# Patient Record
Sex: Female | Born: 1970 | Race: Black or African American | Hispanic: No | Marital: Single | Smoking: Never smoker
Health system: Southern US, Community
[De-identification: ages and names within clinical notes are randomized; demographics above are authoritative.]

## PROBLEM LIST (undated history)

## (undated) DIAGNOSIS — E079 Disorder of thyroid, unspecified: Secondary | ICD-10-CM

## (undated) HISTORY — PX: ABDOMINAL SURGERY: SHX537

---

## 2016-05-11 ENCOUNTER — Other Ambulatory Visit: Payer: Self-pay | Admitting: Internal Medicine

## 2016-05-11 DIAGNOSIS — Z1231 Encounter for screening mammogram for malignant neoplasm of breast: Secondary | ICD-10-CM

## 2016-06-01 ENCOUNTER — Ambulatory Visit
Admission: RE | Admit: 2016-06-01 | Discharge: 2016-06-01 | Disposition: A | Discharge status: Home / Self Care | Payer: PRIVATE HEALTH INSURANCE | Source: Ambulatory Visit | Attending: Internal Medicine | Admitting: Internal Medicine

## 2016-06-01 DIAGNOSIS — Z1231 Encounter for screening mammogram for malignant neoplasm of breast: Secondary | ICD-10-CM

## 2016-06-17 ENCOUNTER — Other Ambulatory Visit (HOSPITAL_COMMUNITY)
Admission: RE | Admit: 2016-06-17 | Discharge: 2016-06-17 | Disposition: A | Discharge status: Home / Self Care | Payer: No Typology Code available for payment source | Source: Ambulatory Visit | Attending: Obstetrics and Gynecology | Admitting: Obstetrics and Gynecology

## 2016-06-17 ENCOUNTER — Other Ambulatory Visit: Payer: Self-pay | Admitting: Obstetrics and Gynecology

## 2016-06-17 DIAGNOSIS — Z1151 Encounter for screening for human papillomavirus (HPV): Secondary | ICD-10-CM | POA: Insufficient documentation

## 2016-06-17 DIAGNOSIS — Z01419 Encounter for gynecological examination (general) (routine) without abnormal findings: Secondary | ICD-10-CM | POA: Insufficient documentation

## 2016-06-21 LAB — CYTOLOGY - PAP
Diagnosis: NEGATIVE
HPV: NOT DETECTED

## 2017-11-29 ENCOUNTER — Emergency Department (HOSPITAL_COMMUNITY)
Admission: EM | Admit: 2017-11-29 | Discharge: 2017-11-29 | Disposition: A | Discharge status: Home / Self Care | Payer: No Typology Code available for payment source | Attending: Emergency Medicine | Admitting: Emergency Medicine

## 2017-11-29 ENCOUNTER — Other Ambulatory Visit: Payer: Self-pay

## 2017-11-29 ENCOUNTER — Encounter (HOSPITAL_COMMUNITY): Payer: Self-pay

## 2017-11-29 DIAGNOSIS — Y92410 Unspecified street and highway as the place of occurrence of the external cause: Secondary | ICD-10-CM | POA: Diagnosis not present

## 2017-11-29 DIAGNOSIS — M549 Dorsalgia, unspecified: Secondary | ICD-10-CM | POA: Diagnosis present

## 2017-11-29 DIAGNOSIS — M7918 Myalgia, other site: Secondary | ICD-10-CM | POA: Diagnosis not present

## 2017-11-29 HISTORY — DX: Disorder of thyroid, unspecified: E07.9

## 2017-11-29 MED ORDER — METHOCARBAMOL 500 MG PO TABS: 500.0000 mg | ORAL_TABLET | Freq: Two times a day (BID) | ORAL | 0 refills | Status: AC

## 2017-11-29 MED ORDER — LIDOCAINE 5 % EX PTCH: 1.0000 | MEDICATED_PATCH | CUTANEOUS | 0 refills | Status: AC

## 2017-11-29 MED ORDER — ACETAMINOPHEN 325 MG PO TABS
650.0000 mg | ORAL_TABLET | Freq: Once | ORAL | Status: AC
  Administered 2017-11-29: 650 mg via ORAL
  Filled 2017-11-29: qty 2
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
## 2017-11-29 NOTE — ED Notes (Signed)
ED Provider at bedside. 
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
## 2017-11-29 NOTE — ED Notes (Signed)
Patient verbalizes understanding of discharge instructions. Opportunity for questioning and answers were provided. Armband removed by staff, pt discharged from ED ambulatory.   

## 2019-05-27 ENCOUNTER — Ambulatory Visit: Payer: PRIVATE HEALTH INSURANCE | Attending: Internal Medicine

## 2019-05-27 DIAGNOSIS — Z20822 Contact with and (suspected) exposure to covid-19: Secondary | ICD-10-CM

## 2019-05-28 LAB — SARS-COV-2, NAA 2 DAY TAT

## 2019-05-28 LAB — NOVEL CORONAVIRUS, NAA: SARS-CoV-2, NAA: NOT DETECTED

## 2019-12-30 ENCOUNTER — Ambulatory Visit (INDEPENDENT_AMBULATORY_CARE_PROVIDER_SITE_OTHER): Payer: 59 | Admitting: Orthopedic Surgery

## 2019-12-30 ENCOUNTER — Ambulatory Visit: Payer: Self-pay

## 2019-12-30 ENCOUNTER — Ambulatory Visit (INDEPENDENT_AMBULATORY_CARE_PROVIDER_SITE_OTHER): Payer: 59

## 2019-12-30 ENCOUNTER — Encounter: Payer: Self-pay | Admitting: Orthopedic Surgery

## 2019-12-30 DIAGNOSIS — M79671 Pain in right foot: Secondary | ICD-10-CM | POA: Diagnosis not present

## 2019-12-30 DIAGNOSIS — M79672 Pain in left foot: Secondary | ICD-10-CM | POA: Diagnosis not present

## 2019-12-30 NOTE — Progress Notes (Signed)
   Office Visit Note   Patient: Gabriella Sherman           Date of Birth: 07/05/1970           MRN: [id number] Visit Date: 12/30/2019              Requested by: Renford Dills, MD [address] [place],  [place] [address] PCP: Renford Dills, MD  Chief Complaint  Patient presents with  . Left Foot - Pain    Heel pain referral from Dr. Nehemiah Settle       HPI: Patient is a 49 year old woman who is seen for initial evaluation for bilateral heel pain.  She complains of pain with walking and driving in both heels pain posteriorly not over the Achilles or over the plantar fascia.  Assessment & Plan: Visit Diagnoses:  1. Pain of left heel   2. Pain of right heel     Plan: Patient has a large callus on both feet.  Recommended thin wool socks to absorb the moisture recommended a well cushioned sneaker like [place]oka.  Follow-up if this does not improve.  Follow-Up Instructions: Return if symptoms worsen or fail to improve.   Ortho Exam  Patient is alert, oriented, no adenopathy, well-dressed, normal affect, normal respiratory effort. Examination patient has good pulses bilaterally she has good ankle good subtalar motion there is no pain at rest.  She has no pain to palpation over the Achilles no pain to palpation over the plantar fascia she has good dorsiflexion of the ankle.  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  No nodules in the Achilles and no plantar fascial nodules.  Imaging: XR Os Calcis Right  Result Date: 12/30/2019 2 view radiographs of the right calcaneus shows no evidence of fracture no bony spurs.  No images are attached to the encounter.  Labs: No results found for: HGBA1C, ESRSEDRATE, CRP, LABURIC, REPTSTATUS, GRAMSTAIN, CULT, LABORGA   No results found for: ALBUMIN, PREALBUMIN, LABURIC  No results found for: MG No  results found for: VD25OH  No results found for: PREALBUMIN No flowsheet data found.   There is no height or weight on file to calculate BMI.  Orders:  Orders Placed This Encounter  Procedures  . XR Os Calcis Left  . XR Os Calcis Right   No orders of the defined types were placed in this encounter.    Procedures: No procedures performed  Clinical Data: No additional findings.  ROS:  All other systems negative, except as noted in the HPI. Review of Systems  Objective: Vital Signs: There were no vitals taken for this visit.  Specialty Comments:  No specialty comments available.  PMFS History: There are no problems to display for this patient.  Past Medical History:  Diagnosis Date  . Thyroid disease     History reviewed. No pertinent family history.  Past Surgical History:  Procedure Laterality Date  . ABDOMINAL SURGERY     Social History   Occupational History  . Not on file  Tobacco Use  . Smoking status: Never Smoker  . Smokeless tobacco: Never Used  Vaping Use  . Vaping Use: Never used  Substance and Sexual Activity  . Alcohol use: Never  . Drug use: Never  . Sexual activity: Not Currently    Birth control/protection: Pill

## 2020-08-10 ENCOUNTER — Other Ambulatory Visit: Payer: Self-pay

## 2020-08-10 ENCOUNTER — Other Ambulatory Visit: Payer: Self-pay | Admitting: Internal Medicine

## 2020-08-10 ENCOUNTER — Ambulatory Visit
Admission: RE | Admit: 2020-08-10 | Discharge: 2020-08-10 | Disposition: A | Discharge status: Home / Self Care | Payer: 59 | Source: Ambulatory Visit | Attending: Internal Medicine | Admitting: Internal Medicine

## 2020-08-10 DIAGNOSIS — R198 Other specified symptoms and signs involving the digestive system and abdomen: Secondary | ICD-10-CM

## 2020-12-05 ENCOUNTER — Encounter (HOSPITAL_COMMUNITY): Payer: Self-pay | Admitting: Emergency Medicine

## 2020-12-05 ENCOUNTER — Other Ambulatory Visit: Payer: Self-pay

## 2020-12-05 ENCOUNTER — Ambulatory Visit (HOSPITAL_COMMUNITY)
Admission: EM | Admit: 2020-12-05 | Discharge: 2020-12-05 | Disposition: A | Discharge status: Home / Self Care | Payer: 59 | Attending: Student | Admitting: Student

## 2020-12-05 DIAGNOSIS — I493 Ventricular premature depolarization: Secondary | ICD-10-CM

## 2020-12-05 DIAGNOSIS — R9431 Abnormal electrocardiogram [ECG] [EKG]: Secondary | ICD-10-CM

## 2020-12-05 LAB — POC INFLUENZA A AND B ANTIGEN (URGENT CARE ONLY)
INFLUENZA A ANTIGEN, POC: NEGATIVE
INFLUENZA B ANTIGEN, POC: NEGATIVE

## 2020-12-05 LAB — CBG MONITORING, ED: Glucose-Capillary: 95 mg/dL (ref 70–99)

## 2020-12-05 NOTE — ED Provider Notes (Signed)
[facility]    CSN: [id number] Arrival date & time: 12/05/20  1034      History   Chief Complaint Chief Complaint  Patient presents with   Dizziness    HPI Gabriella Sherman is a 50 y.o. female presenting with dizziness and nausea x1 day. Medical history hypothyroid, taking daily levothyroxine. States symptoms are overall improving since this morning. Describes the dizziness as sensation of room spinning, constant. No triggers or exacerbations, denies positional or exertional component. Initially with nausea and one episode of vomiting but no longer. Denies CP, SOB, weakness. Denies other symptoms like fevers/chills, cough, congestion. Denies history cardio ds. Followed closely by PCP.  HPI  Past Medical History:  Diagnosis Date   Thyroid disease     There are no problems to display for this patient.   Past Surgical History:  Procedure Laterality Date   ABDOMINAL SURGERY      OB History   No obstetric history on file.      Home Medications    Prior to Admission medications   Medication Sig Start Date End Date Taking? Authorizing Provider  levothyroxine (SYNTHROID) 100 MCG tablet Take 100 mcg by mouth daily. 11/23/20   [provider]  lidocaine (LIDODERM) 5 % Place 1 patch onto the skin daily. Remove & Discard patch within 12 hours or as directed by MD Patient not taking: Reported on 12/05/2020 11/29/17   Harlene Salts A, PA-C  methocarbamol (ROBAXIN) 500 MG tablet Take 1 tablet (500 mg total) by mouth 2 (two) times daily. Patient not taking: Reported on 12/05/2020 11/29/17   Bill Salinas PA-C    Family History Family History  Problem Relation Age of Onset   Hypertension Mother    Diabetes Father     Social History Social History   Tobacco Use   Smoking status: Never   Smokeless tobacco: Never  Vaping Use   Vaping Use: Never used  Substance Use Topics   Alcohol use: Never   Drug use: Never     Allergies   Percocet  [oxycodone-acetaminophen] and Vicodin [hydrocodone-acetaminophen]   Review of Systems Review of Systems  Respiratory:  Negative for shortness of breath.   Cardiovascular:  Negative for chest pain, palpitations and leg swelling.  Gastrointestinal:  Negative for nausea and vomiting.  Neurological:  Positive for dizziness.  All other systems reviewed and are negative.   Physical Exam Triage Vital Signs ED Triage Vitals  Enc Vitals Group     BP 12/05/20 1209 (!) 143/88     Pulse Rate 12/05/20 1209 73     Resp 12/05/20 1209 18     Temp 12/05/20 1209 98.3 F (36.8 C)     Temp Source 12/05/20 1209 Oral     SpO2 12/05/20 1209 96 %     Weight --      Height --      Head Circumference --      Peak Flow --      Pain Score 12/05/20 1206 0     Pain Loc --      Pain Edu? --      Excl. in GC? --    No data found.  Updated Vital Signs BP (!) 143/88 (BP Location: Left Arm)   Pulse 73   Temp 98.3 F (36.8 C) (Oral)   Resp 18   LMP 11/28/2017   SpO2 96%   Visual Acuity Right Eye Distance:   Left Eye Distance:   Bilateral Distance:  Right Eye Near:   Left Eye Near:    Bilateral Near:     Physical Exam Vitals reviewed.  Constitutional:      Appearance: Normal appearance. She is not diaphoretic.  HENT:     Head: Normocephalic and atraumatic.     Mouth/Throat:     Mouth: Mucous membranes are moist.  Eyes:     Extraocular Movements: Extraocular movements intact.     Pupils: Pupils are equal, round, and reactive to light.  Cardiovascular:     Rate and Rhythm: Normal rate and regular rhythm.     Pulses:          Radial pulses are 2+ on the right side and 2+ on the left side.     Heart sounds: Normal heart sounds.  Pulmonary:     Effort: Pulmonary effort is normal.     Breath sounds: Normal breath sounds.  Abdominal:     Palpations: Abdomen is soft.     Tenderness: There is no abdominal tenderness. There is no guarding or rebound.  Musculoskeletal:     Right lower  leg: No edema.     Left lower leg: No edema.  Skin:    General: Skin is warm.     Capillary Refill: Capillary refill takes less than 2 seconds.  Neurological:     General: No focal deficit present.     Mental Status: She is alert and oriented to person, place, and time.     Comments: CN 2-12 grossly intact, PERRLA, EOMI. Strength and sensation grossly intact. Negative rhomberg.  Psychiatric:        Mood and Affect: Mood normal.        Behavior: Behavior normal.        Thought Content: Thought content normal.        Judgment: Judgment normal.     UC Treatments / Results  Labs (all labs ordered are listed, but only abnormal results are displayed) Labs Reviewed  POC INFLUENZA A AND B ANTIGEN (URGENT CARE ONLY)  CBG MONITORING, ED    EKG   Radiology No results found.  Procedures Procedures (including critical care time)  Medications Ordered in UC Medications - No data to display  Initial Impression / Assessment and Plan / UC Course  I have reviewed the triage vital signs and the nursing notes.  Pertinent labs & imaging results that were available during my care of the patient were reviewed by me and considered in my medical decision making (see chart for details).     This patient is a very pleasant 50 y.o. year old female presenting with PVCs.   EKG with PVCs. No prior EKG for comparison  Symptoms are resolving on their own and patient adamantly declines ED referral. She will closely monitor symptoms at home and head to ED if they worsen. Call PCP next business day for follow-up and cardio referral.  Recommended ED evaluation, patient prefers to f/u with cardio outpatient. Discharged to home with strict precautions.  Final Clinical Impressions(s) / UC Diagnoses   Final diagnoses:  Premature ventricular contraction  Nonspecific abnormal electrocardiogram (ECG) (EKG)     Discharge Instructions      Premature ventricular contractions (PVCs) are extra  heartbeats that begin in one of the heart's two lower pumping chambers (ventricles). These extra beats disrupt the regular heart rhythm, sometimes causing a sensation of a fluttering or a skipped beat in the chest.  Occasional premature ventricular contractions in people without heart disease usually aren't a  concern and likely don't need treatment. You might need treatment if the premature ventricular contractions are very frequent or bothersome, or if you have an underlying heart condition.  Please call your PCP in 2 days (Monday) to schedule a follow-up. You will need to see a cardiologist.  If symptoms get worse in the next 2 days- shortness of breath, weakness, chest pain, dizziness, etc - head straight to the ED or call 911.     ED Prescriptions   None    PDMP not reviewed this encounter.   Rhys Martini, PA-C 12/05/20 1301

## 2020-12-05 NOTE — Discharge Instructions (Addendum)
Premature ventricular contractions (PVCs) are extra heartbeats that begin in one of the heart's two lower pumping chambers (ventricles). These extra beats disrupt the regular heart rhythm, sometimes causing a sensation of a fluttering or a skipped beat in the chest.  Occasional premature ventricular contractions in people without heart disease usually aren't a concern and likely don't need treatment. You might need treatment if the premature ventricular contractions are very frequent or bothersome, or if you have an underlying heart condition.  Please call your PCP in 2 days (Monday) to schedule a follow-up. You will need to see a cardiologist.  If symptoms get worse in the next 2 days- shortness of breath, weakness, chest pain, dizziness, etc - head straight to the ED or call 911.

## 2020-12-05 NOTE — ED Triage Notes (Signed)
Dizziness and nausea that started today.  Denies chest pain.  Reports dizziness has improved.  In the morning, reports room was spinning

## 2020-12-21 NOTE — Progress Notes (Addendum)
Cardiology Office Note:    Date:  12/21/2020   ID:  Trinh Sanjose, DOB November 12, 1970, MRN [id number]  PCP:  Renford Dills, MD   [facility] Providers Cardiologist:  None     Referring MD: Emilio Aspen, *   No chief complaint on file. PVCs   History of Present Illness:    Gabriella Sherman is a 50 y.o. female with a hx of thyroid dx, migraines and obesity, seen in the ED 12/05/20 with dizziness and nause, EKG shows bigeminy with RVOT PVCs  She has no heart disease history.  No testing. She had sever dizziness, nausea and weakness prompting her to go to the ED in November. It lasted for 4-5 hours then it improved. She had COVID in July. She denies chest pain. She denies short of breath. She feels fast heart beats. She gets lightheadedness for a few minutes. No syncope.She has a ziopatch on. She had an echocardiogram, not in the system. She was told that the echo showed frequent PVCs. No family hx of arrhythmia. She has family hx of hypertension. No family hx of SCD. No smoking history.   She has hypothyroidism. She had TSH 6.87 and synthroid dose was increased.   Past Medical History:  Diagnosis Date   Thyroid disease     Past Surgical History:  Procedure Laterality Date   ABDOMINAL SURGERY      Current Medications: No outpatient medications have been marked as taking for the 12/22/20 encounter (Appointment) with Maisie Fus, MD.     Allergies:   Percocet [oxycodone-acetaminophen] and Vicodin [hydrocodone-acetaminophen]   Social History   Socioeconomic History   Marital status: Single    Spouse name: Not on file   Number of children: Not on file   Years of education: Not on file   Highest education level: Not on file  Occupational History   Not on file  Tobacco Use   Smoking status: Never   Smokeless tobacco: Never  Vaping Use   Vaping Use: Never used  Substance and Sexual Activity   Alcohol use: Never   Drug use: Never   Sexual activity: Not Currently     Birth control/protection: None  Other Topics Concern   Not on file  Social History Narrative   Not on file   Social Determinants of Health   Financial Resource Strain: Not on file  Food Insecurity: Not on file  Transportation Needs: Not on file  Physical Activity: Not on file  Stress: Not on file  Social Connections: Not on file     Family History: The patient's family history includes Diabetes in her father; Hypertension in her mother.  ROS:   Please see the history of present illness.     All other systems reviewed and are negative.  EKGs/Labs/Other Studies Reviewed:    The following studies were reviewed today:   EKG:  EKG is  ordered today.  The ekg ordered today demonstrates  NSR , minimal ventricular ectopy, RVOT PVCs  Recent Labs: No results found for requested labs within last 8760 hours.  Recent Lipid Panel No results found for: CHOL, TRIG, HDL, CHOLHDL, VLDL, LDLCALC, LDLDIRECT   Risk Assessment/Calculations:     The 10-year ASCVD risk score (Arnett DK, et al., 2019) is: 1.8%   Values used to calculate the score:     Age: 34 years     Sex: Female     Is Non-Hispanic African American: Yes     Diabetic: No  Tobacco smoker: No     Systolic Blood Pressure: 130 mmHg     Is BP treated: No     HDL Cholesterol: 66 MG/DL     Total Cholesterol: 195 MG/DL       Physical Exam:    VS:  LMP 11/28/2017     Wt Readings from Last 3 Encounters:  11/29/17 170 lb (77.1 kg)     GEN:  Well nourished, well developed in no acute distress HEENT: Normal NECK: No JVD; No carotid bruits LYMPHATICS: No lymphadenopathy CARDIAC: RRR, no murmurs, rubs, gallops RESPIRATORY:  Clear to auscultation without rales, wheezing or rhonchi  ABDOMEN: Soft, non-tender, non-distended MUSCULOSKELETAL:  No edema; No deformity  SKIN: Warm and dry NEUROLOGIC:  Alert and oriented x 3 PSYCHIATRIC:  Normal affect   ASSESSMENT:    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 Ddx includes benign ; however can r/o ARVC. She has no prior cardiac imaging in our system. ? Epsilon wave of the PVC beat in V1 in November, not  there today. Will plan for cardiac MRI. She has a ziopatch on and I can follow up results as long as her PCP sends them over. Will add low dose beta blocker. Will FU in 3 months; if burden is high/ more symptoms will start BB and EP referral at that time.  PLAN:    In order of problems listed above:  Cardiac MRI Metoprolol 12.5 mg XL Follow up 3 months      Medication Adjustments/Labs and Tests Ordered: Current medicines are reviewed at length with the patient today.  Concerns regarding medicines are outlined above.   Signed, Maisie Fus, MD  12/21/2020 1:58 PM    [facility]

## 2020-12-22 ENCOUNTER — Other Ambulatory Visit: Payer: Self-pay

## 2020-12-22 ENCOUNTER — Encounter: Payer: Self-pay | Admitting: Internal Medicine

## 2020-12-22 ENCOUNTER — Ambulatory Visit (INDEPENDENT_AMBULATORY_CARE_PROVIDER_SITE_OTHER): Payer: 59 | Admitting: Internal Medicine

## 2020-12-22 VITALS — BP 130/73 | HR 66 | Ht 63.0 in | Wt 169.4 lb

## 2020-12-22 DIAGNOSIS — R9431 Abnormal electrocardiogram [ECG] [EKG]: Secondary | ICD-10-CM

## 2020-12-22 DIAGNOSIS — I493 Ventricular premature depolarization: Secondary | ICD-10-CM

## 2020-12-22 LAB — BASIC METABOLIC PANEL
BUN/Creatinine Ratio: 15 (ref 9–23)
BUN: 13 mg/dL (ref 6–24)
CO2: 24 mmol/L (ref 20–29)
Calcium: 10 mg/dL (ref 8.7–10.2)
Chloride: 104 mmol/L (ref 96–106)
Creatinine, Ser: 0.85 mg/dL (ref 0.57–1.00)
Glucose: 96 mg/dL (ref 70–99)
Potassium: 4.2 mmol/L (ref 3.5–5.2)
Sodium: 140 mmol/L (ref 134–144)
eGFR: 83 mL/min/{1.73_m2} (ref >59)

## 2020-12-22 LAB — CBC
Hematocrit: 36.3 % (ref 34.0–46.6)
Hemoglobin: 12.4 g/dL (ref 11.1–15.9)
MCH: 28.5 pg (ref 26.6–33.0)
MCHC: 34.2 g/dL (ref 31.5–35.7)
MCV: 83 fL (ref 79–97)
Platelets: 163 10*3/uL (ref 150–450)
RBC: 4.35 x10E6/uL (ref 3.77–5.28)
RDW: 14.2 % (ref 11.7–15.4)
WBC: 4.8 10*3/uL (ref 3.4–10.8)

## 2020-12-22 MED ORDER — METOPROLOL SUCCINATE ER 25 MG PO TB24: 12.5000 mg | ORAL_TABLET | Freq: Every day | ORAL | 3 refills | Status: DC

## 2020-12-22 NOTE — Addendum Note (Signed)
Addended byCarolan Clines on: 12/22/2020 08:40 AM   Modules accepted: Orders

## 2020-12-22 NOTE — Patient Instructions (Addendum)
Medication Instructions:  START: METOPROLOL SUCCINATE 12.5mg  (1/2) TABLET ONCE DAILY  *If you need a refill on your cardiac medications before your next appointment, please call your pharmacy*  Lab Work: BMET AND CBC TODAY  If you have labs (blood work) drawn today and your tests are completely normal, you will receive your results only by: MyChart Message (if you have MyChart) OR A paper copy in the mail If you have any lab test that is abnormal or we need to change your treatment, we will call you to review the results.  Testing/Procedures: Your physician has requested that you have a cardiac MRI. Cardiac MRI uses a computer to create images of your heart as its beating, producing both still and moving pictures of your heart and major blood vessels. For further information please visit [email or web address]. Please follow the instruction sheet given to you today for more information.     You are scheduled for Cardiac MRI on ______________. Please arrive at the [facility] main entrance of [facility] at ________________ (30-45 minutes prior to test start time). ?  [facility] [address] [place], [place] [address] [phone]  Please take advantage of the free valet parking available at the MAIN entrance (A entrance). Proceed to the [facility] Radiology Department (First Floor). ? Magnetic resonance imaging (MRI) is a painless test that produces images of the inside of the body without using Xrays.  During an MRI, strong magnets and radio waves work together in a Data processing manager to form detailed images.   MRI images may provide more details about a medical condition than X-rays, CT scans, and ultrasounds can provide.  You may be given earphones to listen for instructions.  You may eat a light breakfast and take medications as ordered with the exception of HCTZ (fluid pill, other). Please avoid stimulants for 12 hr prior to test. (Ie. Caffeine, nicotine,  chocolate, or antihistamine medications)  If a contrast material will be used, an IV will be inserted into one of your veins. Contrast material will be injected into your IV. It will leave your body through your urine within a day. You may be told to drink plenty of fluids to help flush the contrast material out of your system.  You will be asked to remove all metal, including: Watch, jewelry, and other metal objects including hearing aids, hair pieces and dentures. Also wearable glucose monitoring systems (ie. Freestyle Libre and Omnipods) (Braces and fillings normally are not a problem.)   TEST WILL TAKE APPROXIMATELY 1 HOUR  PLEASE NOTIFY SCHEDULING AT LEAST 24 HOURS IN ADVANCE IF YOU ARE UNABLE TO KEEP YOUR APPOINTMENT. [phone]  Please call Rockwell Alexandria, cardiac imaging nurse navigator with any questions/concerns. Rockwell Alexandria RN Navigator Cardiac Imaging Larey Brick RN Navigator Cardiac Imaging [facility] [phone] Office   Follow-Up: At [facility], you and your health needs are our priority.  As part of our continuing mission to provide you with exceptional heart care, we have created designated Provider Care Teams.  These Care Teams include your primary Cardiologist (physician) and Advanced Practice Providers (APPs -  Physician Assistants and Nurse Practitioners) who all work together to provide you with the care you need, when you need it.  Your next appointment:   3 month(s)  The format for your next appointment:   In Person  Provider:   Maisie Fus, MD

## 2020-12-30 ENCOUNTER — Other Ambulatory Visit: Payer: Self-pay | Admitting: Internal Medicine

## 2020-12-30 DIAGNOSIS — Z1231 Encounter for screening mammogram for malignant neoplasm of breast: Secondary | ICD-10-CM

## 2021-01-04 ENCOUNTER — Ambulatory Visit
Admission: RE | Admit: 2021-01-04 | Discharge: 2021-01-04 | Disposition: A | Discharge status: Home / Self Care | Payer: 59 | Source: Ambulatory Visit | Attending: Internal Medicine | Admitting: Internal Medicine

## 2021-01-04 DIAGNOSIS — Z1231 Encounter for screening mammogram for malignant neoplasm of breast: Secondary | ICD-10-CM

## 2021-01-14 ENCOUNTER — Telehealth (HOSPITAL_COMMUNITY): Payer: Self-pay | Admitting: Emergency Medicine

## 2021-01-14 NOTE — Telephone Encounter (Signed)
Reaching out to patient to offer assistance regarding upcoming cardiac imaging study; pt verbalizes understanding of appt date/time, parking situation and where to check in, and verified current allergies; name and call back number provided for further questions should they arise Rockwell Alexandria RN Navigator Cardiac Imaging [facility] [phone] office [phone] cell   Pt requesting one time dose antianxiety medication for claustro (message sent to Dr. Wyline Mood) Some IV difficulty in the past Denies metal implants Arrival time [phone]

## 2021-01-15 ENCOUNTER — Other Ambulatory Visit: Payer: Self-pay

## 2021-01-15 ENCOUNTER — Ambulatory Visit (HOSPITAL_COMMUNITY)
Admission: RE | Admit: 2021-01-15 | Discharge: 2021-01-15 | Disposition: A | Discharge status: Home / Self Care | Payer: 59 | Source: Ambulatory Visit | Attending: Internal Medicine | Admitting: Internal Medicine

## 2021-01-15 DIAGNOSIS — R9431 Abnormal electrocardiogram [ECG] [EKG]: Secondary | ICD-10-CM | POA: Diagnosis present

## 2021-01-15 DIAGNOSIS — I493 Ventricular premature depolarization: Secondary | ICD-10-CM | POA: Insufficient documentation

## 2021-01-15 MED ORDER — LORAZEPAM 2 MG/ML IJ SOLN: 0.5000 mg | Freq: Once | INTRAMUSCULAR | Status: DC

## 2021-01-15 MED ORDER — GADOBUTROL 1 MMOL/ML IV SOLN
10.0000 mL | Freq: Once | INTRAVENOUS | Status: AC | PRN
  Administered 2021-01-15: 11:00:00 10 mL via INTRAVENOUS

## 2021-01-15 MED ORDER — LORAZEPAM 2 MG/ML IJ SOLN
INTRAMUSCULAR | Status: AC
  Filled 2021-01-15: qty 1

## 2021-01-21 ENCOUNTER — Telehealth: Payer: Self-pay | Admitting: Internal Medicine

## 2021-01-21 NOTE — Telephone Encounter (Signed)
Spoke with Gabriella Sherman . She had zio patch ordered by her PCP showing < 1 % burden of atrial flutter. Will scan into the chart.  Her CHADS2VASC is 1 which is very low risk for stroke and anticoagulation is not required.  She has significant thyroid disease which is likely contributing to her arrhythmias. She is pending a repeat TSH and we discussed importance of regulating her thyroid function. Will route to her PCP

## 2021-04-14 ENCOUNTER — Encounter: Payer: Self-pay | Admitting: Internal Medicine

## 2021-04-14 ENCOUNTER — Ambulatory Visit: Payer: 59 | Admitting: Internal Medicine

## 2021-04-14 VITALS — BP 120/72 | HR 64 | Ht 63.0 in | Wt 167.2 lb

## 2021-04-14 DIAGNOSIS — I499 Cardiac arrhythmia, unspecified: Secondary | ICD-10-CM | POA: Diagnosis not present

## 2021-04-14 MED ORDER — METOPROLOL SUCCINATE ER 25 MG PO TB24: 12.5000 mg | ORAL_TABLET | Freq: Every day | ORAL | 3 refills | Status: DC

## 2021-04-14 NOTE — Patient Instructions (Signed)
Medication Instructions:  Your physician recommends that you continue on your current medications as directed. Please refer to the Current Medication list given to you today.  *If you need a refill on your cardiac medications before your next appointment, please call your pharmacy*  Follow-Up: At [facility], you and your health needs are our priority.  As part of our continuing mission to provide you with exceptional heart care, we have created designated Provider Care Teams.  These Care Teams include your primary Cardiologist (physician) and Advanced Practice Providers (APPs -  Physician Assistants and Nurse Practitioners) who all work together to provide you with the care you need, when you need it.  We recommend signing up for the patient portal called "MyChart".  Sign up information is provided on this After Visit Summary.  MyChart is used to connect with patients for Virtual Visits (Telemedicine).  Patients are able to view lab/test results, encounter notes, upcoming appointments, etc.  Non-urgent messages can be sent to your provider as well.   To learn more about what you can do with MyChart, go to [email or web address].    Your next appointment:   6 month(s)  The format for your next appointment:   In Person  Provider:   Branch, Mary E, MD { 

## 2021-04-14 NOTE — Progress Notes (Signed)
?Cardiology Office Note:   ? ?Date:  04/14/2021  ? ?ID:  Gabriella Sherman, DOB 1970/05/07, MRN [id number] ? ?PCP:  Renford Dills, MD ?  ?[facility] ?Cardiologist:  Maisie Fus, MD    ? ?Referring MD: Renford Dills, MD  ? ?No chief complaint on file. ?PVCs  ? ?History of Present Illness:   ? ?Gabriella Sherman is a 51 y.o. female with a hx of thyroid dx, migraines and obesity, seen in the ED 12/05/20 with dizziness and nause, EKG shows bigeminy with RVOT PVCs ? ?She has no heart disease history.  No testing. She had sever dizziness, nausea and weakness prompting her to go to the ED in November. It lasted for 4-5 hours then it improved. She had COVID in July. She denies chest pain. She denies short of breath. She feels fast heart beats. She gets lightheadedness for a few minutes. No syncope.She has a ziopatch on. She had an echocardiogram, not in the system. She was told that the echo showed frequent PVCs. No family hx of arrhythmia. She has family hx of hypertension. No family hx of SCD. No smoking history.  ? ?She has hypothyroidism. She had TSH 6.87 and synthroid dose was increased.  ? ?Interim Hx: ?She had low TSH again. Her synthroid was decreased from 100 to 75 mcg. This was 2 weeks ago. She is on metoprolol XL 12.5 mg daily. She denies palpitations. She is doing well. ? ? ?Cardiology Studies ?Cardiac Monitor- <1 % atrial flutter burden, SVT ? ?Cardiac MRI 01/15/2021- Normal LV/RV function. No LGE. No ARVC ? ? ?Past Medical History:  ?Diagnosis Date  ? Thyroid disease   ? ? ?Past Surgical History:  ?Procedure Laterality Date  ? ABDOMINAL SURGERY    ? ? ?Current Medications: ?Current Meds  ?Medication Sig  ? levothyroxine (SYNTHROID) 100 MCG tablet Take 100 mcg by mouth daily.  ? lidocaine (LIDODERM) 5 % Place 1 patch onto the skin daily. Remove & Discard patch within 12 hours or as directed by MD  ? meclizine (ANTIVERT) 12.5 MG tablet 1 tablet as needed  ? methocarbamol (ROBAXIN) 500 MG tablet Take 1  tablet (500 mg total) by mouth 2 (two) times daily.  ?  ? ?Allergies:   Percocet [oxycodone-acetaminophen] and Vicodin [hydrocodone-acetaminophen]  ? ?Social History  ? ?Socioeconomic History  ? Marital status: Single  ?  Spouse name: Not on file  ? Number of children: Not on file  ? Years of education: Not on file  ? Highest education level: Not on file  ?Occupational History  ? Not on file  ?Tobacco Use  ? Smoking status: Never  ? Smokeless tobacco: Never  ?Vaping Use  ? Vaping Use: Never used  ?Substance and Sexual Activity  ? Alcohol use: Never  ? Drug use: Never  ? Sexual activity: Not Currently  ?  Birth control/protection: None  ?Other Topics Concern  ? Not on file  ?Social History Narrative  ? Not on file  ? ?Social Determinants of Health  ? ?Financial Resource Strain: Not on file  ?Food Insecurity: Not on file  ?Transportation Needs: Not on file  ?Physical Activity: Not on file  ?Stress: Not on file  ?Social Connections: Not on file  ?  ? ?Family History: ?The patient's family history includes Diabetes in her father; Hypertension in her mother. ? ?ROS:   ?Please see the history of present illness.    ? All other systems reviewed and are negative. ? ?EKGs/Labs/Other Studies Reviewed:   ? ?  The following studies were reviewed today: ? ? ?EKG:  EKG is  ordered today.  The ekg ordered today demonstrates ? ?EKG 12/22/2020- NSR , minimal ventricular ectopy, RVOT PVCs ? ? ? ?Recent Labs: ?12/22/2020: BUN 13; Creatinine, Ser 0.85; Hemoglobin 12.4; Platelets 163; Potassium 4.2; Sodium 140  ?Recent Lipid Panel ?No results found for: CHOL, TRIG, HDL, CHOLHDL, VLDL, LDLCALC, LDLDIRECT ? ? ?Risk Assessment/Calculations:   ?  ?The 10-year ASCVD risk score (Arnett DK, et al., 2019) is: 1.3% ?  Values used to calculate the score: ?    Age: 49 years ?    Sex: Female ?    Is Non-Hispanic African American: Yes ?    Diabetic: No ?    Tobacco smoker: No ?    Systolic Blood Pressure: 120 mmHg ?    Is BP treated: No ?    HDL  Cholesterol: 66 MG/DL ?    Total Cholesterol: 195 MG/DL ? ?    ? ?Physical Exam:   ? ?VS:  BP 120/72   Pulse 64   Ht 5\' 3"  (1.6 m)   Wt 167 lb 3.2 oz (75.8 kg)   LMP 11/28/2017   SpO2 99%   BMI 29.62 kg/m?    ? ?Wt Readings from Last 3 Encounters:  ?04/14/21 167 lb 3.2 oz (75.8 kg)  ?12/22/20 169 lb 6.4 oz (76.8 kg)  ?11/29/17 170 lb (77.1 kg)  ?  ? ?GEN:  Well nourished, well developed in no acute distress ?HEENT: Normal ?NECK: No JVD; No carotid bruits ?LYMPHATICS: No lymphadenopathy ?CARDIAC: RRR, no murmurs, rubs, gallops ?RESPIRATORY:  Clear to auscultation without rales, wheezing or rhonchi  ?ABDOMEN: Soft, non-tender, non-distended ?MUSCULOSKELETAL:  No edema; No deformity  ?SKIN: Warm and dry ?NEUROLOGIC:  Alert and oriented x 3 ?PSYCHIATRIC:  Normal affect  ? ?ASSESSMENT:   ? 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 Cardiac MRI did not show scar, ARVC. This is benign. Started low dose BB. ? ?Atrial Flutter- < 1 % burden of atrial flutter noted on cardiac monitor. CHADS2VASC=1, no AC recommended. Recommend continued thyroid dx management. Continue BB per above. ? ?PLAN:   ? ?In order of problems listed above: ? ?Refilled metop ?Follow up 6 months ? ?   ? ?Medication Adjustments/Labs and Tests Ordered: ?Current medicines are reviewed at length with the patient today.  Concerns regarding medicines are outlined above.  ? ?Signed, ?12/01/17, MD  ?04/14/2021 1:20 PM    ?[facility] ? ?

## 2021-10-01 ENCOUNTER — Ambulatory Visit: Payer: 59 | Admitting: Internal Medicine

## 2022-02-15 DIAGNOSIS — R109 Unspecified abdominal pain: Secondary | ICD-10-CM | POA: Diagnosis not present

## 2022-04-23 ENCOUNTER — Other Ambulatory Visit: Payer: Self-pay | Admitting: Internal Medicine

## 2022-05-02 DIAGNOSIS — Z0000 Encounter for general adult medical examination without abnormal findings: Secondary | ICD-10-CM | POA: Diagnosis not present

## 2022-05-28 ENCOUNTER — Other Ambulatory Visit: Payer: Self-pay | Admitting: Internal Medicine

## 2022-11-21 IMAGING — MR MR CARD MORPHOLOGY WO/W CM
45 of 48 series · 45 of 48 positions shown · IV contrast (Contrast agent)
Comparison: none

CLINICAL DATA: Frequent PVCs

EXAM:
CARDIAC MRI
TECHNIQUE: The patient was scanned on a 1.5 Tesla GE magnet. A dedicated
cardiac coil was used. Functional imaging was done using Fiesta
sequences. [DATE], and 4 chamber views were done to assess for RWMA's.
Modified Aujla rule using a short axis stack was used to
calculate an ejection fraction on a dedicated work station using
Circle software. The patient received 8 cc of Gadavist. After 10
minutes inversion recovery sequences were used to assess for
infiltration and scar tissue.

[Series 4: [id number]_bh · axial · 8.0mm · 1.48mm/px · 1 of 16 slices shown]
[im 1/16]
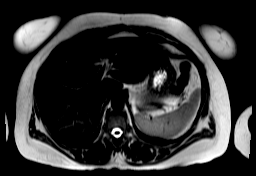

[Series 10: bSSFP · oblique · 8.0mm · 1.70mm/px · 1 of 17 slices shown (1 of 20)]
[im 1/17]
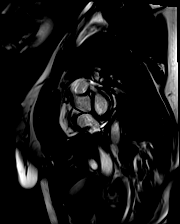

[Series 11: bSSFP · oblique · 8.0mm · 1.70mm/px · 1 of 17 slices shown (2 of 20)]
[im 1/17]
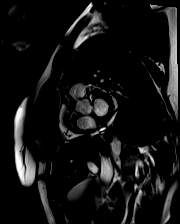

[Series 12: bSSFP · oblique · 8.0mm · 1.70mm/px · 1 of 16 slices shown (3 of 20)]
[im 1/16]
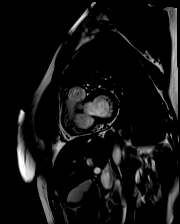

[Series 13: bSSFP · oblique · 8.0mm · 1.70mm/px · 1 of 16 slices shown (4 of 20)]
[im 1/16]
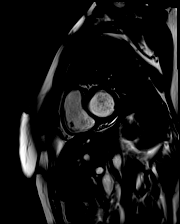

[Series 14: bSSFP · oblique · 8.0mm · 1.70mm/px · 1 of 16 slices shown (5 of 20)]
[im 1/16]
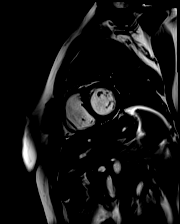

[Series 15: bSSFP · oblique · 8.0mm · 1.70mm/px · 1 of 16 slices shown (6 of 20)]
[im 1/16]
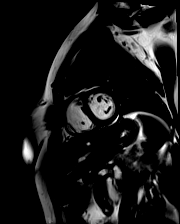

[Series 16: bSSFP · oblique · 8.0mm · 1.70mm/px · 1 of 16 slices shown (7 of 20)]
[im 1/16]
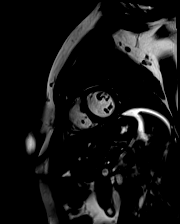

[Series 17: bSSFP · oblique · 8.0mm · 1.70mm/px · 1 of 16 slices shown (8 of 20)]
[im 1/16]
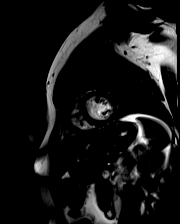

[Series 18: bSSFP · oblique · 8.0mm · 1.70mm/px · 1 of 17 slices shown (9 of 20)]
[im 1/17]
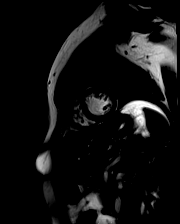

[Series 19: bSSFP · oblique · 8.0mm · 1.70mm/px · 1 of 17 slices shown (10 of 20)]
[im 1/17]
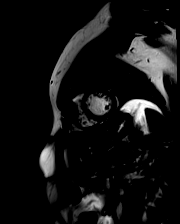

[Series 20: bSSFP · oblique · 8.0mm · 1.70mm/px · 1 of 17 slices shown (11 of 20)]
[im 1/17]
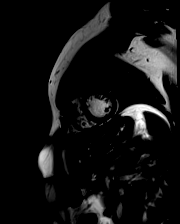

[Series 21: bSSFP · oblique · 8.0mm · 1.70mm/px · 1 of 17 slices shown (12 of 20)]
[im 1/17]
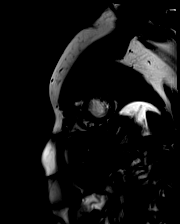

[Series 22: bSSFP · oblique · 8.0mm · 1.70mm/px · 1 of 17 slices shown (13 of 20)]
[im 1/17]
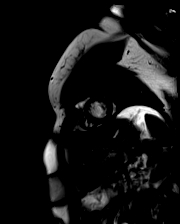

[Series 23: bSSFP · oblique · 8.0mm · 1.70mm/px · 1 of 17 slices shown (14 of 20)]
[im 1/17]
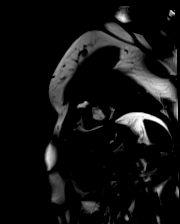

[Series 24: bSSFP · oblique · 8.0mm · 1.70mm/px · 1 of 17 slices shown (15 of 20)]
[im 1/17]
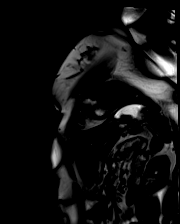

[Series 25: bSSFP · oblique · 8.0mm · 1.70mm/px · 1 of 17 slices shown (16 of 20)]
[im 1/17]
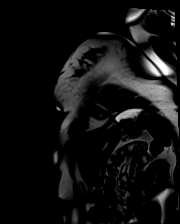

[Series 26: bSSFP · oblique · 8.0mm · 1.70mm/px · 1 of 17 slices shown (17 of 20)]
[im 1/17]
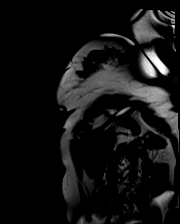

[Series 27: STIR · oblique · 8.0mm · 1.92mm/px · 1 of 14 slices shown]
[im 1/14]
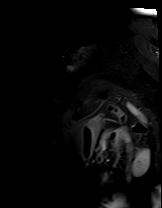

[Series 29: bSSFP · oblique · 6.0mm · 1.48mm/px · 1 of 15 slices shown (18 of 20)]
[im 1/15]
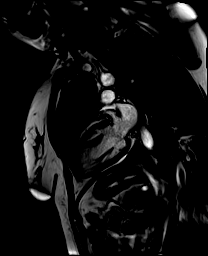

[Series 30: bSSFP · axial · 6.0mm · 1.48mm/px · 1 of 16 slices shown (19 of 20)]
[im 1/16]
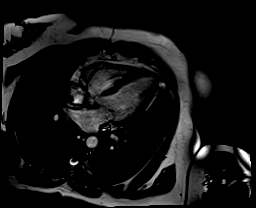

[Series 31: bSSFP · axial · 6.0mm · 1.48mm/px · 1 of 6 slices shown (20 of 20)]
[im 1/6]
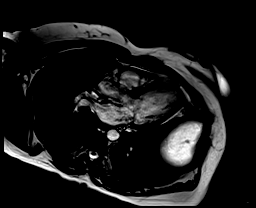

[Series 32: (id)_long_t1 · oblique · 8.0mm · 1.56mm/px · 1 of 24 slices shown]
[im 1/24]
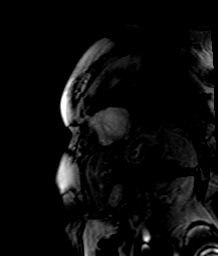

[Series 33: (id)_long_t1_moco · oblique · 8.0mm · 1.56mm/px · 1 of 24 slices shown]
[im 1/24]
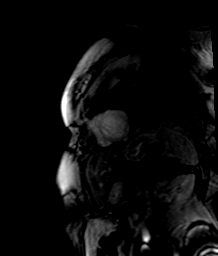

[Series 36: (id)_trufi · oblique · 8.0mm · 2.08mm/px · 1 of 9 slices shown]
[im 1/9]
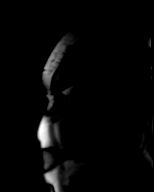

[Series 37: (id)_trufi_moco · oblique · 8.0mm · 2.08mm/px · 1 of 9 slices shown]
[im 1/9]
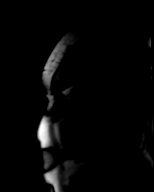

[Series 40: cine_trufi_cs_rt_short axis · oblique · 8.0mm · 1.83mm/px · 1 of 10 slices shown (1 of 14)]
[im 1/10]
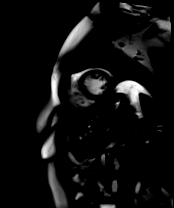

[Series 40: cine_trufi_cs_rt_short axis · oblique · 8.0mm · 1.83mm/px · 1 of 10 slices shown (2 of 14)]
[im 1/10]
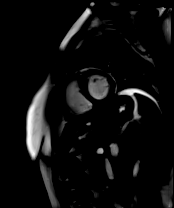

[Series 40: cine_trufi_cs_rt_short axis · oblique · 8.0mm · 1.83mm/px · 1 of 10 slices shown (3 of 14)]
[im 1/10]
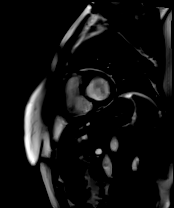

[Series 40: cine_trufi_cs_rt_short axis · oblique · 8.0mm · 1.83mm/px · 1 of 10 slices shown (4 of 14)]
[im 1/10]
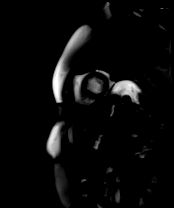

[Series 40: cine_trufi_cs_rt_short axis · oblique · 8.0mm · 1.83mm/px · 1 of 10 slices shown (5 of 14)]
[im 1/10]
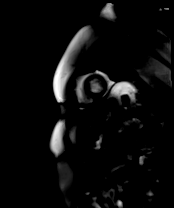

[Series 40: cine_trufi_cs_rt_short axis · oblique · 8.0mm · 1.83mm/px · 1 of 10 slices shown (6 of 14)]
[im 1/10]
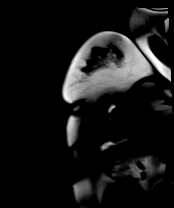

[Series 40: cine_trufi_cs_rt_short axis · oblique · 8.0mm · 1.83mm/px · 1 of 10 slices shown (7 of 14)]
[im 1/10]
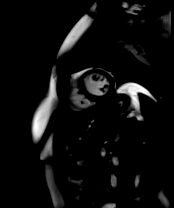

[Series 40: cine_trufi_cs_rt_short axis · oblique · 8.0mm · 1.83mm/px · 1 of 10 slices shown (8 of 14)]
[im 1/10]
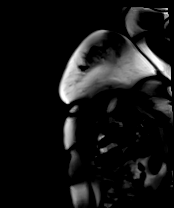

[Series 40: cine_trufi_cs_rt_short axis · oblique · 8.0mm · 1.83mm/px · 1 of 10 slices shown (9 of 14)]
[im 1/10]
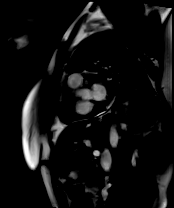

[Series 40: cine_trufi_cs_rt_short axis · oblique · 8.0mm · 1.83mm/px · 1 of 10 slices shown (10 of 14)]
[im 1/10]
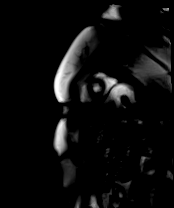

[Series 40: cine_trufi_cs_rt_short axis · oblique · 8.0mm · 1.83mm/px · 1 of 10 slices shown (11 of 14)]
[im 1/10]
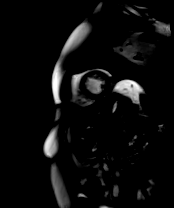

[Series 40: cine_trufi_cs_rt_short axis · oblique · 8.0mm · 1.83mm/px · 1 of 10 slices shown (12 of 14)]
[im 1/10]
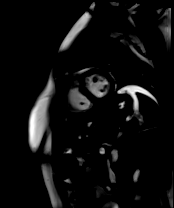

[Series 40: cine_trufi_cs_rt_short axis · oblique · 8.0mm · 1.83mm/px · 1 of 10 slices shown (13 of 14)]
[im 1/10]
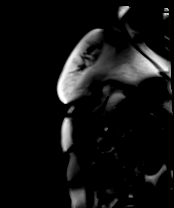

[Series 40: cine_trufi_cs_rt_short axis · oblique · 8.0mm · 1.83mm/px · 1 of 10 slices shown (14 of 14)]
[im 1/10]
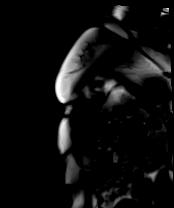

[Series 42: cine_trufi_cs_rt_2ch · oblique · 6.0mm · 1.83mm/px · 1 of 12 slices shown]
[im 1/12]
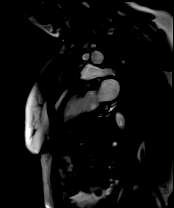

[Series 44: pre short axis · oblique · non-contrast · 8.0mm · 2.38mm/px · 1 of 10 slices shown (1 of 4)]
[im 1/10]
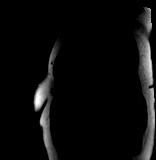

[Series 45: pre short axis · oblique · non-contrast · 8.0mm · 2.38mm/px · 1 of 10 slices shown (2 of 4)]
[im 1/10]
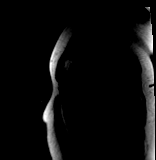

[Series 46: pre short axis · oblique · non-contrast · 8.0mm · 2.38mm/px · 1 of 10 slices shown (3 of 4)]
[im 1/10]
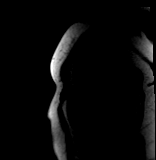

[Series 47: pre short axis · oblique · non-contrast · 8.0mm · 2.38mm/px · 1 of 10 slices shown (4 of 4)]
[im 1/10]
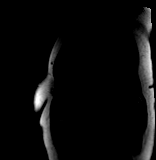

[45 of 48 positions shown; findings below may reference images not displayed]

FINDINGS: Limited images of the lung fields showed no gross abnormalities.

Normal left ventricular size and wall thickness. Normal wall motion,
EF 60%. Normal right ventricular size, wall motion, and systolic
function, EF 48%. Normal left atrial size. Normal right atrial size.
No significant aortic valve regurgitation or stenosis noted,
Trileaflet valve. No significant mitral regurgitation noted though
flow sequences to confirm amount of MR not done.

Normal first pass perfusion images.

On delayed enhancement imaging, there was no myocardial late
gadolinium enhancement (LGE).

MEASUREMENTS:
MEASUREMENTS
LVEDV 95 mL
LVSV 57 mL
LVEF 60%

RVEDV 99 mL
RVSV 48 mL
RVEF 48%

T1 7467, ECV 27%
IMPRESSION: 1.  Normal LV size with EF 60%, normal wall motion.

2.  Normal RV size and systolic function, EF 48%.

3. No myocardial LGE so no definitive evidence for prior MI,
infiltrative disease, or myocarditis.

4.  Normal T1 parameters.

5.  No evidence for ARVC.

Laiminga Lelyte [place]ite

## 2023-04-20 ENCOUNTER — Other Ambulatory Visit: Payer: Self-pay | Admitting: Internal Medicine

## 2023-04-20 DIAGNOSIS — Z1231 Encounter for screening mammogram for malignant neoplasm of breast: Secondary | ICD-10-CM

## 2023-04-20 DIAGNOSIS — R202 Paresthesia of skin: Secondary | ICD-10-CM | POA: Diagnosis not present

## 2023-04-20 DIAGNOSIS — E039 Hypothyroidism, unspecified: Secondary | ICD-10-CM | POA: Diagnosis not present

## 2023-04-22 ENCOUNTER — Ambulatory Visit
Admission: RE | Admit: 2023-04-22 | Discharge: 2023-04-22 | Disposition: A | Discharge status: Home / Self Care | Payer: Self-pay | Source: Ambulatory Visit | Attending: Internal Medicine | Admitting: Internal Medicine

## 2023-04-22 DIAGNOSIS — Z1231 Encounter for screening mammogram for malignant neoplasm of breast: Secondary | ICD-10-CM | POA: Diagnosis not present

## 2023-05-16 DIAGNOSIS — Z0000 Encounter for general adult medical examination without abnormal findings: Secondary | ICD-10-CM | POA: Diagnosis not present

## 2023-05-23 DIAGNOSIS — Z01419 Encounter for gynecological examination (general) (routine) without abnormal findings: Secondary | ICD-10-CM | POA: Diagnosis not present

## 2023-05-23 DIAGNOSIS — Z1151 Encounter for screening for human papillomavirus (HPV): Secondary | ICD-10-CM | POA: Diagnosis not present

## 2023-05-23 DIAGNOSIS — Z124 Encounter for screening for malignant neoplasm of cervix: Secondary | ICD-10-CM | POA: Diagnosis not present

## 2023-05-23 DIAGNOSIS — N809 Endometriosis, unspecified: Secondary | ICD-10-CM | POA: Diagnosis not present

## 2023-06-15 DIAGNOSIS — Z1211 Encounter for screening for malignant neoplasm of colon: Secondary | ICD-10-CM | POA: Diagnosis not present

## 2023-06-22 DIAGNOSIS — K5730 Diverticulosis of large intestine without perforation or abscess without bleeding: Secondary | ICD-10-CM | POA: Diagnosis not present

## 2023-06-22 DIAGNOSIS — K648 Other hemorrhoids: Secondary | ICD-10-CM | POA: Diagnosis not present

## 2023-06-22 DIAGNOSIS — Z1211 Encounter for screening for malignant neoplasm of colon: Secondary | ICD-10-CM | POA: Diagnosis not present
